# Patient Record
Sex: Female | Born: 1970 | Race: Black or African American | Hispanic: No | Marital: Married
Health system: Southern US, Community
[De-identification: ages and names within clinical notes are randomized; demographics above are authoritative.]

---

## 2002-01-22 ENCOUNTER — Encounter: Payer: Self-pay | Admitting: Emergency Medicine

## 2002-01-22 ENCOUNTER — Emergency Department (HOSPITAL_COMMUNITY): Admission: EM | Admit: 2002-01-22 | Discharge: 2002-01-22 | Payer: Self-pay | Admitting: Emergency Medicine

## 2002-02-14 ENCOUNTER — Encounter: Payer: Self-pay | Admitting: Emergency Medicine

## 2002-02-14 ENCOUNTER — Emergency Department (HOSPITAL_COMMUNITY): Admission: EM | Admit: 2002-02-14 | Discharge: 2002-02-14 | Payer: Self-pay | Admitting: Emergency Medicine

## 2002-11-10 ENCOUNTER — Observation Stay (HOSPITAL_COMMUNITY): Admission: RE | Admit: 2002-11-10 | Discharge: 2002-11-11 | Payer: Self-pay | Admitting: General Surgery

## 2002-11-10 ENCOUNTER — Encounter: Payer: Self-pay | Admitting: General Surgery

## 2002-11-10 ENCOUNTER — Encounter (INDEPENDENT_AMBULATORY_CARE_PROVIDER_SITE_OTHER): Payer: Self-pay | Admitting: Specialist

## 2004-05-31 ENCOUNTER — Ambulatory Visit (HOSPITAL_COMMUNITY): Admission: RE | Admit: 2004-05-31 | Discharge: 2004-05-31 | Payer: Self-pay | Admitting: Obstetrics

## 2004-06-14 ENCOUNTER — Encounter (INDEPENDENT_AMBULATORY_CARE_PROVIDER_SITE_OTHER): Payer: Self-pay | Admitting: *Deleted

## 2004-06-14 ENCOUNTER — Ambulatory Visit (HOSPITAL_COMMUNITY): Admission: RE | Admit: 2004-06-14 | Discharge: 2004-06-14 | Payer: Self-pay | Admitting: Obstetrics

## 2005-08-13 ENCOUNTER — Inpatient Hospital Stay (HOSPITAL_COMMUNITY): Admission: RE | Admit: 2005-08-13 | Discharge: 2005-08-20 | Payer: Self-pay | Admitting: Psychiatry

## 2005-08-14 ENCOUNTER — Ambulatory Visit: Payer: Self-pay | Admitting: Psychiatry

## 2008-08-15 ENCOUNTER — Inpatient Hospital Stay (HOSPITAL_COMMUNITY): Admission: AD | Admit: 2008-08-15 | Discharge: 2008-08-20 | Payer: Self-pay | Admitting: Psychiatry

## 2008-08-15 ENCOUNTER — Ambulatory Visit: Payer: Self-pay | Admitting: Psychiatry

## 2010-03-18 ENCOUNTER — Emergency Department (HOSPITAL_COMMUNITY): Admission: EM | Admit: 2010-03-18 | Discharge: 2010-03-18 | Payer: Self-pay | Admitting: Emergency Medicine

## 2010-10-19 LAB — DIFFERENTIAL
Basophils Absolute: 0 10*3/uL (ref 0.0–0.1)
Basophils Relative: 0 % (ref 0–1)
Eosinophils Absolute: 0.1 10*3/uL (ref 0.0–0.7)
Eosinophils Relative: 2 % (ref 0–5)
Lymphocytes Relative: 33 % (ref 12–46)
Lymphs Abs: 1.8 10*3/uL (ref 0.7–4.0)
Monocytes Absolute: 0.5 10*3/uL (ref 0.1–1.0)
Monocytes Relative: 10 % (ref 3–12)
Neutro Abs: 3 10*3/uL (ref 1.7–7.7)
Neutrophils Relative %: 56 % (ref 43–77)

## 2010-10-19 LAB — COMPREHENSIVE METABOLIC PANEL
ALT: 24 U/L (ref 0–35)
AST: 52 U/L — ABNORMAL HIGH (ref 0–37)
Albumin: 3.5 g/dL (ref 3.5–5.2)
Alkaline Phosphatase: 53 U/L (ref 39–117)
BUN: 10 mg/dL (ref 6–23)
CO2: 25 mEq/L (ref 19–32)
Calcium: 8.6 mg/dL (ref 8.4–10.5)
Chloride: 107 mEq/L (ref 96–112)
Creatinine, Ser: 0.65 mg/dL (ref 0.4–1.2)
GFR calc Af Amer: >60 mL/min (ref >60)
GFR calc non Af Amer: >60 mL/min (ref >60)
Glucose, Bld: 112 mg/dL — ABNORMAL HIGH (ref 70–99)
Potassium: 5.3 mEq/L — ABNORMAL HIGH (ref 3.5–5.1)
Sodium: 137 mEq/L (ref 135–145)
Total Bilirubin: 1.7 mg/dL — ABNORMAL HIGH (ref 0.3–1.2)
Total Protein: 6.6 g/dL (ref 6.0–8.3)

## 2010-10-19 LAB — URINALYSIS, ROUTINE W REFLEX MICROSCOPIC
Bilirubin Urine: NEGATIVE
Glucose, UA: NEGATIVE mg/dL
Hgb urine dipstick: NEGATIVE
Ketones, ur: NEGATIVE mg/dL
Nitrite: NEGATIVE
Protein, ur: NEGATIVE mg/dL
Specific Gravity, Urine: 1.025 (ref 1.005–1.030)
Urobilinogen, UA: 1 mg/dL (ref 0.0–1.0)
pH: 6 (ref 5.0–8.0)

## 2010-10-19 LAB — CBC
HCT: 38.6 % (ref 36.0–46.0)
Hemoglobin: 13.3 g/dL (ref 12.0–15.0)
MCH: 30.2 pg (ref 26.0–34.0)
MCHC: 34.5 g/dL (ref 30.0–36.0)
MCV: 87.5 fL (ref 78.0–100.0)
Platelets: 196 10*3/uL (ref 150–400)
RBC: 4.41 MIL/uL (ref 3.87–5.11)
RDW: 12.8 % (ref 11.5–15.5)
WBC: 5.4 10*3/uL (ref 4.0–10.5)

## 2010-10-19 LAB — URINE MICROSCOPIC-ADD ON

## 2010-10-19 LAB — POCT PREGNANCY, URINE: Preg Test, Ur: NEGATIVE

## 2010-10-19 LAB — LIPASE, BLOOD: Lipase: 20 U/L (ref 11–59)

## 2010-11-19 LAB — URINALYSIS, ROUTINE W REFLEX MICROSCOPIC
Bilirubin Urine: NEGATIVE
Glucose, UA: NEGATIVE mg/dL
Hgb urine dipstick: NEGATIVE
Ketones, ur: NEGATIVE mg/dL
Nitrite: NEGATIVE
Protein, ur: NEGATIVE mg/dL
Specific Gravity, Urine: 1.028 (ref 1.005–1.030)
Urobilinogen, UA: 0.2 mg/dL (ref 0.0–1.0)
pH: 6.5 (ref 5.0–8.0)

## 2010-11-19 LAB — DIFFERENTIAL
Basophils Absolute: 0 10*3/uL (ref 0.0–0.1)
Basophils Relative: 0 % (ref 0–1)
Eosinophils Absolute: 0.2 10*3/uL (ref 0.0–0.7)
Eosinophils Relative: 2 % (ref 0–5)
Lymphocytes Relative: 31 % (ref 12–46)
Lymphs Abs: 2.4 10*3/uL (ref 0.7–4.0)
Monocytes Absolute: 0.5 10*3/uL (ref 0.1–1.0)
Monocytes Relative: 7 % (ref 3–12)
Neutro Abs: 4.7 10*3/uL (ref 1.7–7.7)
Neutrophils Relative %: 60 % (ref 43–77)

## 2010-11-19 LAB — COMPREHENSIVE METABOLIC PANEL
ALT: 21 U/L (ref 0–35)
AST: 22 U/L (ref 0–37)
Albumin: 3.7 g/dL (ref 3.5–5.2)
Alkaline Phosphatase: 74 U/L (ref 39–117)
BUN: 14 mg/dL (ref 6–23)
CO2: 29 mEq/L (ref 19–32)
Calcium: 9.4 mg/dL (ref 8.4–10.5)
Chloride: 104 mEq/L (ref 96–112)
Creatinine, Ser: 0.9 mg/dL (ref 0.4–1.2)
GFR calc Af Amer: >60 mL/min (ref >60)
GFR calc non Af Amer: >60 mL/min (ref >60)
Glucose, Bld: 107 mg/dL — ABNORMAL HIGH (ref 70–99)
Potassium: 3.8 mEq/L (ref 3.5–5.1)
Sodium: 139 mEq/L (ref 135–145)
Total Bilirubin: 0.5 mg/dL (ref 0.3–1.2)
Total Protein: 6.9 g/dL (ref 6.0–8.3)

## 2010-11-19 LAB — CBC
HCT: 39.5 % (ref 36.0–46.0)
Hemoglobin: 13.6 g/dL (ref 12.0–15.0)
MCHC: 34.4 g/dL (ref 30.0–36.0)
MCV: 87.6 fL (ref 78.0–100.0)
Platelets: 207 10*3/uL (ref 150–400)
RBC: 4.5 MIL/uL (ref 3.87–5.11)
RDW: 12.9 % (ref 11.5–15.5)
WBC: 7.8 10*3/uL (ref 4.0–10.5)

## 2010-11-19 LAB — TSH: TSH: 1.411 u[IU]/mL (ref 0.350–4.500)

## 2010-11-19 LAB — CARBAMAZEPINE LEVEL, TOTAL: Carbamazepine Lvl: 8 ug/mL (ref 4.0–12.0)

## 2010-11-19 LAB — VALPROIC ACID LEVEL: Valproic Acid Lvl: <1 ug/mL — ABNORMAL LOW (ref 50.0–100.0)

## 2010-12-21 NOTE — Op Note (Signed)
NAMEMICHELLE, WNEK         ACCOUNT NO.:  [id number]   MEDICAL RECORD NO.:  [id number]          PATIENT TYPE:  AMB   LOCATION:  SDC                           FACILITY:  [facility]   PHYSICIAN:  Charles A. Clearance Coots, M.D.DATE OF BIRTH:  11-Feb-1971   DATE OF PROCEDURE:  06/14/2004  DATE OF DISCHARGE:                                 OPERATIVE REPORT   PREOPERATIVE DIAGNOSES:  Menorrhagia.   POSTOPERATIVE DIAGNOSES:  Menorrhagia.   PROCEDURE:  Hysteroscopy and D&C, bipolar endometrial ablation.   SURGEON:  Charles A. Clearance Coots, M.D.   ANESTHESIA:  MAC with paracervical block.   ESTIMATED BLOOD LOSS:  Negligible.   COMPLICATIONS:  None.   SPECIMENS:  Endometrial curettings.   DESCRIPTION OF PROCEDURE:  The patient was brought to the operating room and  after satisfactory IV sedation, legs were brought up in stirrups and the  vagina was prepped and draped in the usual sterile fashion.  The urinary  bladder was emptied of approximately 50 mL of clear urine. Bimanual  examination revealed the uterus to be mid position, normal size, shape and  contour.  A weighted speculum was placed in the vaginal vault and the cervix  was isolated.  The anterior lip of the cervix was grasped with a single  tooth tenaculum. The distance to the anatomic internal os of the cervix was  measured with a Hegar dilator and was found to be 4 cm from the external os  to the internal os.  The uterus was then sounded to 10 cm with a total  operative cavity length of 6 cm.  The hysteroscope was then introduced into  the uterine cavity revealed a lush endometrium but no evidence of  endometrial polyps.  The hysteroscope was then retired. The endometrial  surface was then curetted with a small serrated curette and the specimen was  submitted to pathology for evaluation.  The cervix was then dilated to a #21  Pratt dilator and the bipolar endometrial ablation procedure was then  performed in routine fashion at a power  of 132 watts and a time of 49  minutes.  The bipolar instrument was then retired and followup hysteroscopic  survey revealed the endometrial surface to be thoroughly ablated.  The  hysteroscope was then retired. The patient tolerated the procedure well and  was transported to the recovery room in satisfactory condition.     Char   CAH/MEDQ  D:  06/14/2004  T:  06/14/2004  Job:  [id number]
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
## 2010-12-21 NOTE — Op Note (Signed)
NAMEJULEAH, Becky Hampton                          ACCOUNT NO.:  [id number]   MEDICAL RECORD NO.:  [id number]                   PATIENT TYPE:  OBV   LOCATION:  0468                                 [facility]   PHYSICIAN:  Angelia Mould. Derrell Lolling, M.D.             DATE OF BIRTH:  [birth year]   DATE OF PROCEDURE:  11/10/2002  DATE OF DISCHARGE:                                 OPERATIVE REPORT   PREOPERATIVE DIAGNOSES:  Chronic cholecystitis with cholelithiasis.   POSTOPERATIVE DIAGNOSES:  Chronic cholecystitis with cholelithiasis.   OPERATION PERFORMED:  Laparoscopic cholecystectomy with intraoperative  cholangiogram.   SURGEON:  Angelia Mould. Derrell Lolling, M.D.   FIRST ASSISTANT:  Maisie Fus B. Samuella Cota, M.D.   INDICATIONS FOR PROCEDURE:  This is a 40 year old black female who has a one  year history of intermittent episodes of right upper quadrant pain and right  flank pain, often in the evening after supper. She has had mild intermittent  nausea and vomiting. The pain has intensified over the past three weeks. She  states that she has had some fever up to 101 at home. She was evaluated by  [facility]. On the gallbladder ultrasound at [facility] it  showed multiple gallstones but an otherwise normal imaging of the liver,  bile ducts, kidneys, spleen, pancreas and aorta. I saw her in the office on  October 28, 2002 at which time she was feeling better. Her abdomen was soft  but obese and just slightly tender in the right upper quadrant and no  palpable mass. Liver function tests are normal. She is brought to the  operating room electively.   FINDINGS:  The gallbladder was slightly thick walled, slightly discolored  and contained numerous yellow multifaceted stones. The anatomy of the cystic  duct and cystic artery and common bile duct were normal. The cholangiogram  showed small but normal intrahepatic and extrahepatic bile ducts, no filling  defects, and prompt flow of contrast into the  duodenum. The omentum was  quite extensive and obscured visualization of the small bowel and colon. The  peritoneal surfaces looked normal. There were a couple of adhesions from the  dome of the liver to the diaphragm.   TECHNIQUE:  Following the induction of general endotracheal anesthesia, the  patient's abdomen was prepped and draped in a sterile fashion. A 0.5%  Marcaine with epinephrine was used as a local infiltration anesthetic. A  vertically oriented incision was made inside the upper rim of the umbilicus.  The fascia was incised in the midline and the abdominal cavity entered under  direct vision. A 10 mm Hasson trocar was inserted and secured with a  pursestring suture of #0 Vicryl. Pneumoperitoneum was created. A video  camera was inserted with visualization and findings as described above. A 10  mm trocar was placed in the subxiphoid region and two 5 mm trocars placed in  the right upper quadrant. The gallbladder  was identified, elevated and  retracted. We dissected out the cystic duct and the cystic artery. We  isolated the cystic artery as it went onto the gallbladder wall, secured it  with metal clips and divided it. We created a large window behind the cystic  duct. We placed a metal clip on the cystic duct close to the gallbladder. A  cholangiogram was inserted into the cystic duct and a cholangiogram was  obtained using the C-arm. The cholangiogram showed normal intrahepatic and  extrahepatic bile ducts, small caliber ducts, but no filling defects and  prompt flow of contrast into the duodenum. The cholangiogram catheter was  then removed and the cystic duct was secured with multiple metal clips and  divided. The gallbladder was dissected from its bed with electrocautery and  removed through the umbilical port. The operative field was irrigated with  saline. Hemostasis was excellent. At the completion of the case, there was  no bleeding and no bile leak whatsoever. The  irrigation fluid was completely  clear. The trocars were removed under direct vision and there was no  bleeding from the trocar sites. The pneumoperitoneum was released, the  fascia at the umbilicus was closed with #0 Vicryl sutures. The skin  incisions were closed with subcuticular sutures of 4-0 Vicryl and Steri-  Strips. Clean bandages were placed and the patient taken to the recovery  room in stable condition. Estimated blood loss was about 20 mL.  Complications none. Sponge, needle and instrument counts were correct.                                                Angelia Mould. Derrell Lolling, M.D.    HMI/MEDQ  D:  11/10/2002  T:  11/11/2002  Job:  [id number]   cc:   Gabriel Earing, M.D.  [address]  [place]  [place] [address]  Fax: [phone]
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
## 2014-09-17 ENCOUNTER — Emergency Department: Payer: Self-pay | Admitting: Emergency Medicine

## 2014-09-17 DIAGNOSIS — R6884 Jaw pain: Secondary | ICD-10-CM | POA: Diagnosis not present

## 2014-09-17 DIAGNOSIS — K029 Dental caries, unspecified: Secondary | ICD-10-CM | POA: Diagnosis not present

## 2014-09-17 DIAGNOSIS — K088 Other specified disorders of teeth and supporting structures: Secondary | ICD-10-CM | POA: Diagnosis not present

## 2014-09-17 DIAGNOSIS — K0500 Acute gingivitis, plaque induced: Secondary | ICD-10-CM | POA: Diagnosis not present

## 2014-09-17 DIAGNOSIS — K0510 Chronic gingivitis, plaque induced: Secondary | ICD-10-CM | POA: Diagnosis not present

## 2014-12-31 DIAGNOSIS — N938 Other specified abnormal uterine and vaginal bleeding: Secondary | ICD-10-CM | POA: Diagnosis not present

## 2016-11-09 ENCOUNTER — Encounter: Payer: Self-pay | Admitting: Radiology

## 2016-11-09 ENCOUNTER — Emergency Department: Payer: Self-pay

## 2016-11-09 ENCOUNTER — Emergency Department
Admission: EM | Admit: 2016-11-09 | Discharge: 2016-11-10 | Disposition: A | Discharge status: Home / Self Care | Payer: Self-pay | Attending: Emergency Medicine | Admitting: Emergency Medicine

## 2016-11-09 DIAGNOSIS — N200 Calculus of kidney: Secondary | ICD-10-CM | POA: Insufficient documentation

## 2016-11-09 DIAGNOSIS — N83202 Unspecified ovarian cyst, left side: Secondary | ICD-10-CM | POA: Insufficient documentation

## 2016-11-09 DIAGNOSIS — D259 Leiomyoma of uterus, unspecified: Secondary | ICD-10-CM | POA: Insufficient documentation

## 2016-11-09 DIAGNOSIS — R102 Pelvic and perineal pain: Secondary | ICD-10-CM

## 2016-11-09 LAB — COMPREHENSIVE METABOLIC PANEL
ALBUMIN: 4.3 g/dL (ref 3.5–5.0)
ALK PHOS: 69 U/L (ref 38–126)
ALT: 22 U/L (ref 14–54)
ANION GAP: 11 (ref 5–15)
AST: 36 U/L (ref 15–41)
BILIRUBIN TOTAL: 0.9 mg/dL (ref 0.3–1.2)
BUN: 14 mg/dL (ref 6–20)
CALCIUM: 9.4 mg/dL (ref 8.9–10.3)
CO2: 26 mmol/L (ref 22–32)
Chloride: 103 mmol/L (ref 101–111)
Creatinine, Ser: 1.04 mg/dL — ABNORMAL HIGH (ref 0.44–1.00)
GLUCOSE: 130 mg/dL — AB (ref 65–99)
POTASSIUM: 2.7 mmol/L — AB (ref 3.5–5.1)
Sodium: 140 mmol/L (ref 135–145)
TOTAL PROTEIN: 8.3 g/dL — AB (ref 6.5–8.1)

## 2016-11-09 LAB — LIPASE, BLOOD: Lipase: 12 U/L (ref 11–51)

## 2016-11-09 LAB — URINALYSIS, COMPLETE (UACMP) WITH MICROSCOPIC
Bacteria, UA: NONE SEEN
Bilirubin Urine: NEGATIVE
GLUCOSE, UA: NEGATIVE mg/dL
KETONES UR: 5 mg/dL — AB
NITRITE: NEGATIVE
PH: 7 (ref 5.0–8.0)
Protein, ur: NEGATIVE mg/dL
Specific Gravity, Urine: 1.017 (ref 1.005–1.030)

## 2016-11-09 LAB — CBC
HCT: 43 % (ref 35.0–47.0)
HEMOGLOBIN: 14.7 g/dL (ref 12.0–16.0)
MCH: 29.7 pg (ref 26.0–34.0)
MCHC: 34.3 g/dL (ref 32.0–36.0)
MCV: 86.5 fL (ref 80.0–100.0)
Platelets: 215 10*3/uL (ref 150–440)
RBC: 4.97 MIL/uL (ref 3.80–5.20)
RDW: 13.1 % (ref 11.5–14.5)
WBC: 8.1 10*3/uL (ref 3.6–11.0)

## 2016-11-09 LAB — PREGNANCY, URINE: Preg Test, Ur: NEGATIVE

## 2016-11-09 MED ORDER — HYDROMORPHONE HCL 1 MG/ML IJ SOLN
INTRAMUSCULAR | Status: AC
  Filled 2016-11-09: qty 1

## 2016-11-09 MED ORDER — POTASSIUM CHLORIDE CRYS ER 20 MEQ PO TBCR
40.0000 meq | EXTENDED_RELEASE_TABLET | Freq: Once | ORAL | Status: AC
  Administered 2016-11-09: 40 meq via ORAL
  Filled 2016-11-09: qty 2

## 2016-11-09 MED ORDER — ONDANSETRON HCL 4 MG/2ML IJ SOLN
INTRAMUSCULAR | Status: AC
Start: 2016-11-09 — End: 2016-11-09
  Administered 2016-11-09: 4 mg
  Filled 2016-11-09: qty 2

## 2016-11-09 MED ORDER — SODIUM CHLORIDE 0.9 % IV SOLN
30.0000 meq | Freq: Once | INTRAVENOUS | Status: DC
  Filled 2016-11-09: qty 15

## 2016-11-09 MED ORDER — IOPAMIDOL (ISOVUE-300) INJECTION 61%
100.0000 mL | Freq: Once | INTRAVENOUS | Status: AC | PRN
  Administered 2016-11-09: 100 mL via INTRAVENOUS

## 2016-11-09 MED ORDER — IOPAMIDOL (ISOVUE-300) INJECTION 61%
30.0000 mL | Freq: Once | INTRAVENOUS | Status: AC | PRN
  Administered 2016-11-09: 30 mL via ORAL

## 2016-11-09 MED ORDER — SODIUM CHLORIDE 0.9 % IV BOLUS (SEPSIS)
1000.0000 mL | Freq: Once | INTRAVENOUS | Status: AC
  Administered 2016-11-09: 1000 mL via INTRAVENOUS

## 2016-11-09 MED ORDER — ONDANSETRON HCL 4 MG/2ML IJ SOLN
4.0000 mg | Freq: Once | INTRAMUSCULAR | Status: AC
  Administered 2016-11-09: 4 mg via INTRAVENOUS
  Filled 2016-11-09: qty 2

## 2016-11-09 MED ORDER — HYDROMORPHONE HCL 1 MG/ML IJ SOLN
1.0000 mg | Freq: Once | INTRAMUSCULAR | Status: AC
  Administered 2016-11-09: 1 mg via INTRAVENOUS

## 2016-11-09 MED ORDER — MORPHINE SULFATE (PF) 4 MG/ML IV SOLN
4.0000 mg | Freq: Once | INTRAVENOUS | Status: AC
  Administered 2016-11-09: 4 mg via INTRAVENOUS
  Filled 2016-11-09: qty 1

## 2016-11-09 NOTE — ED Notes (Signed)
Patient states 2 weeks ago she had flare up of colitis and symptoms are similar asides from the back pain is new

## 2016-11-09 NOTE — ED Notes (Signed)
IV attempt x2. Will consider [place] attempt.

## 2016-11-09 NOTE — ED Triage Notes (Signed)
Pt to ED for c/o severe lower abdominal pain that started about 1 hour ago, pt also c/o vomiting that started 1 hour ago. Pt has vomited x 2, pt actively vomiting in triage.

## 2016-11-09 NOTE — ED Provider Notes (Addendum)
[facility] Emergency Department Provider Note  ____________________________________________   First MD Initiated Contact with Patient 11/09/16 1830     (approximate)  I have reviewed the triage vital signs and the nursing notes.   HISTORY  Chief Complaint Abdominal Pain    HPI Becky Hampton is a 46 y.o. female without any chronic medical conditions was presenting to the emergency department today with sudden onset left flank pain that started several hours ago. She says that over the past several days she has had nausea vomiting and diarrhea However, she is now having a 10 out of 10 left lower quadrant and flank pain. Says that it is a stabbing type pain that has been constant. 3-4 episodes of diarrhea today. Similar number of  Vomiting episodes. Denies any blood in the vomitus or the stool. Denies any vaginal discharge. Says that she is having vaginal spotting but she is perimenopausal and this is her baseline. Says that she has a history of a cholecystectomy but still has her appendix. Denies any recent antibiotics use.   History reviewed. No pertinent past medical history.  There are no active problems to display for this patient.   No past surgical history on file.  Prior to Admission medications   Not on File    Allergies Aspirin  No family history on file.  Social History Social History  Substance Use Topics  . Smoking status: Not on file  . Smokeless tobacco: Not on file  . Alcohol use Not on file    Review of Systems Constitutional: No fever/chills Eyes: No visual changes. ENT: No sore throat. Cardiovascular: Denies chest pain. Respiratory: Denies shortness of breath. Gastrointestinal:   No constipation. Genitourinary: Negative for dysuria. Musculoskeletal: Negative for back pain. Skin: Negative for rash. Neurological: Negative for headaches, focal weakness or numbness.  10-point ROS otherwise  negative.  ____________________________________________   PHYSICAL EXAM:  VITAL SIGNS: ED Triage Vitals  Enc Vitals Group     BP 11/09/16 1826 118/64     Pulse Rate 11/09/16 1825 64     Resp 11/09/16 1825 16     Temp 11/09/16 1825 98.3 F (36.8 C)     Temp Source 11/09/16 1825 Oral     SpO2 11/09/16 1825 99 %     Weight 11/09/16 1825 208 lb (94.3 kg)     Height --      Head Circumference --      Peak Flow --      Pain Score 11/09/16 1824 10     Pain Loc --      Pain Edu? --      Excl. in [place]? --     Constitutional: Alert and oriented.  Patient has her eyes closed throughout the examination. Takes very deep breaths in between each word throughout the history. Appears uncomfortable. Eyes: Conjunctivae are normal. PERRL. EOMI. Head: Atraumatic. Nose: No congestion/rhinnorhea. Mouth/Throat: Mucous membranes are moist.   Neck: No stridor.   Cardiovascular: Normal rate, regular rhythm. Grossly normal heart sounds.   Respiratory: Normal respiratory effort.  No retractions. Lungs CTAB. Gastrointestinal: Soft with tenderness palpation to the left upper as well as left lower quadrants. No rigidity. No distention. Mild left CVA tenderness palpation. Musculoskeletal: No lower extremity tenderness nor edema.  No joint effusions. Neurologic:  Normal speech and language. No gross focal neurologic deficits are appreciated.  Skin:  Skin is warm, dry and intact. No rash noted. Psychiatric: Mood and affect are normal. Speech and behavior are  normal.  ____________________________________________   LABS (all labs ordered are listed, but only abnormal results are displayed)  Labs Reviewed  COMPREHENSIVE METABOLIC PANEL - Abnormal; Notable for the following:       Result Value   Potassium 2.7 (*)    Glucose, Bld 130 (*)    Creatinine, Ser 1.04 (*)    Total Protein 8.3 (*)    All other components within normal limits  URINALYSIS, COMPLETE (UACMP) WITH MICROSCOPIC - Abnormal; Notable for  the following:    Color, Urine YELLOW (*)    APPearance TURBID (*)    Hgb urine dipstick SMALL (*)    Ketones, ur 5 (*)    Leukocytes, UA MODERATE (*)    Squamous Epithelial / LPF 0-5 (*)    All other components within normal limits  LIPASE, BLOOD  CBC  PREGNANCY, URINE  POC URINE PREG, ED   ____________________________________________  EKG   ____________________________________________  RADIOLOGY    CT Abdomen Pelvis W Contrast (Final result)  Result time 11/09/16 22:04:35  Final result by Massie Kluver, MD (11/09/16 22:04:35)           Narrative:   CLINICAL DATA: Severe lower abdominal pain with vomiting  EXAM: CT ABDOMEN AND PELVIS WITH CONTRAST  TECHNIQUE: Multidetector CT imaging of the abdomen and pelvis was performed using the standard protocol following bolus administration of intravenous contrast.  CONTRAST: 122mL ISOVUE-300 IOPAMIDOL (ISOVUE-300) INJECTION 61%  COMPARISON: None.  FINDINGS: Lower chest: No acute abnormality.  Hepatobiliary: No focal liver abnormality or mass is seen. Status post cholecystectomy. Mild intrahepatic ductal dilatation which can be seen in setting prior cholecystectomy.  Pancreas: Unremarkable. No pancreatic ductal dilatation or surrounding inflammatory changes.  Spleen: Normal in size without focal abnormality.  Adrenals/Urinary Tract: Left-sided perinephric fat stranding with mild to moderate left-sided hydroureteronephrosis secondary to a left UVJ stone measuring 4 x 2 mm. Normal bilateral adrenal glands and right kidney.  Stomach/Bowel: Stomach is within normal limits. Appendix appears normal. No evidence of bowel wall thickening, distention, or inflammatory changes.  Vascular/Lymphatic: No significant vascular findings are present. No enlarged abdominal or pelvic lymph nodes.  Reproductive: Left ovarian simple appearing cysts measuring 4.2 x 3.3 x 3 cm. Normal right ovary. Intramural fibroid noted  within the left uterine body measuring approximately 2.5 x 1.9 x 1.5 cm.  Other: No abdominal wall hernia or abnormality. No abdominopelvic ascites. Soft tissue prominence of the umbilicus consistent with developmental protrusion of the umbilicus.  Musculoskeletal: Mild degenerate change along the dorsal spine. Small sclerotic focus in the right iliac bone consistent a bone island.  IMPRESSION: 1. Left-sided perinephric fat stranding with mild to moderate left-sided hydroureteronephrosis secondary to a left UVJ stone measuring 4 x 2 mm. 2. Simple left ovarian cyst measuring 4 x 2 x 3.3 x 3 cm. 3. Left-sided intramural uterine body fibroid measuring 2.5 x 1.9 x 1.5 cm.   Electronically Signed By: Ashley Royalty M.D. On: 11/09/2016 22:04          ____________________________________________   PROCEDURES  Procedure(s) performed:   Procedures  Critical Care performed:   ____________________________________________   INITIAL IMPRESSION / ASSESSMENT AND PLAN / ED COURSE  Pertinent labs & imaging results that were available during my care of the patient were reviewed by me and considered in my medical decision making (see chart for details).  ----------------------------------------- 12:01 AM on 11/10/2016 -----------------------------------------  Patient's pain is well-controlled after Dilaudid. Found to have a stone as well as a left-sided ovarian cyst.  Also with a small fibroid on the left as well. I suspect that the patient's pain is from a stone. However, I do want to rule out any possible ovarian torsion. She is pending ultrasound at this time. Likely outcome is for the patient be discharged home with pain control, prophylactic antibiotics for a mild UTI as well as Flomax. Patient is understanding of the diagnoses and plan follow-up as an outpatient. Signed out to Dr. Karma Greaser.  Clinical Course as of Apr 08 0001  Sat Nov 09, 2016  2337 Assuming care from Dr.  Clearnce Hasten.  In short, Becky Hampton is a 46 y.o. female with a chief complaint of left sided abdominal pain.  Refer to the original H&P for additional details.  The current plan of care is to follow up pelvic ultrasounds for further evaluation of ovarian cyst.  Patient does have a 4-mm stone as well.   [CF]    Clinical Course User Index [CF] Hinda Kehr, MD     ____________________________________________   FINAL CLINICAL IMPRESSION(S) / ED DIAGNOSES  Final diagnoses:  Pelvic pain  Pelvic pain   Ureteral stone. Ovarian cyst. Uterine fibroid.   NEW MEDICATIONS STARTED DURING THIS VISIT:  New Prescriptions   No medications on file     Note:  This document was prepared using Dragon voice recognition software and may include unintentional dictation errors.    Orbie Pyo, MD 11/10/16 0002  Discussed return precautions such as any worsening abdominal pain, nausea vomiting and fever. Patient knows to return for any worsening or concerning symptoms.    Orbie Pyo, MD 11/10/16 [phone]

## 2016-11-10 MED ORDER — OXYCODONE-ACETAMINOPHEN 5-325 MG PO TABS: 1.0000 | ORAL_TABLET | Freq: Four times a day (QID) | ORAL | 0 refills | Status: AC | PRN

## 2016-11-10 MED ORDER — TAMSULOSIN HCL 0.4 MG PO CAPS: 0.4000 mg | ORAL_CAPSULE | Freq: Every day | ORAL | 0 refills | Status: AC

## 2016-11-10 MED ORDER — CEPHALEXIN 500 MG PO CAPS: 500.0000 mg | ORAL_CAPSULE | Freq: Three times a day (TID) | ORAL | 0 refills | Status: AC

## 2016-11-10 MED ORDER — CEPHALEXIN 500 MG PO CAPS
500.0000 mg | ORAL_CAPSULE | Freq: Once | ORAL | Status: AC
  Administered 2016-11-10: 500 mg via ORAL
  Filled 2016-11-10: qty 1

## 2016-11-10 MED ORDER — POTASSIUM CHLORIDE ER 10 MEQ PO TBCR: 40.0000 meq | EXTENDED_RELEASE_TABLET | Freq: Every day | ORAL | 0 refills | Status: AC

## 2016-11-10 MED ORDER — ONDANSETRON 4 MG PO TBDP
4.0000 mg | ORAL_TABLET | Freq: Once | ORAL | Status: AC
  Administered 2016-11-10: 4 mg via ORAL
  Filled 2016-11-10: qty 1

## 2016-11-10 NOTE — ED Provider Notes (Signed)
Clinical Course as of Nov 11 231  Sat Nov 09, 2016  2337 Assuming care from Dr. Clearnce Hasten.  In short, Becky Hampton is a 46 y.o. female with a chief complaint of left sided abdominal pain.  Refer to the original H&P for additional details.  The current plan of care is to follow up pelvic ultrasounds for further evaluation of ovarian cyst.  Patient does have a 4-mm stone as well.   [CF]  Sun Nov 10, 2016  0231 Ultrasound is reassuring.  Patient is stable.  We will discharge as per the recommendations from Dr. Clearnce Hasten.  She understands and agrees with the plan as does her family.  [CF]    Clinical Course User Index [CF] Hinda Kehr, MD      Hinda Kehr, MD 11/10/16 [phone]

## 2018-11-24 IMAGING — CT CT ABD-PELV W/ CM
2 of 5 series · 16 of 46 positions shown, 18 images · IV contrast (iopamidol)
Comparison: None.

CLINICAL DATA: Severe lower abdominal pain with vomiting

EXAM:
CT ABDOMEN AND PELVIS WITH CONTRAST
TECHNIQUE: Multidetector CT imaging of the abdomen and pelvis was performed
using the standard protocol following bolus administration of
intravenous contrast.
CONTRAST:  100mL [id number] IOPAMIDOL ([id number]) INJECTION 61%

[Series 2: routine abd/pel with · axial · 0.98mm/px · z∈[+1148,+1528]mm · 13 of 88 slices shown, 15 images]
[im 6/88  soft-tissue]
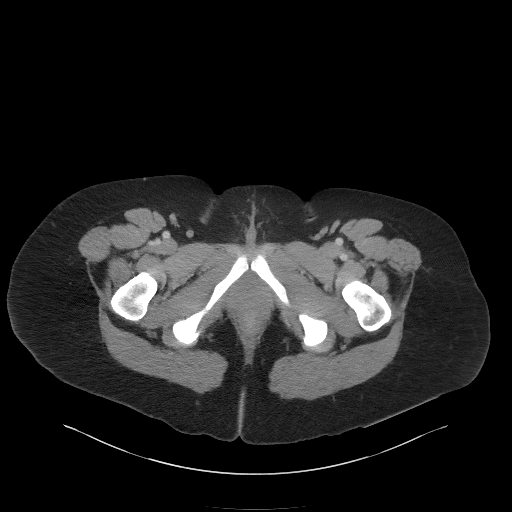
[im 6/88  bone]
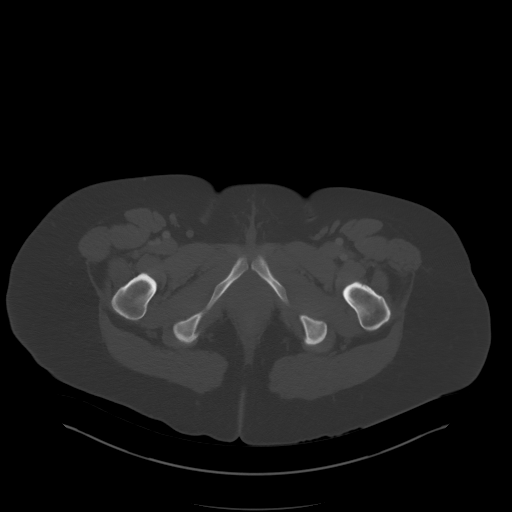
[im 11/88  soft-tissue]
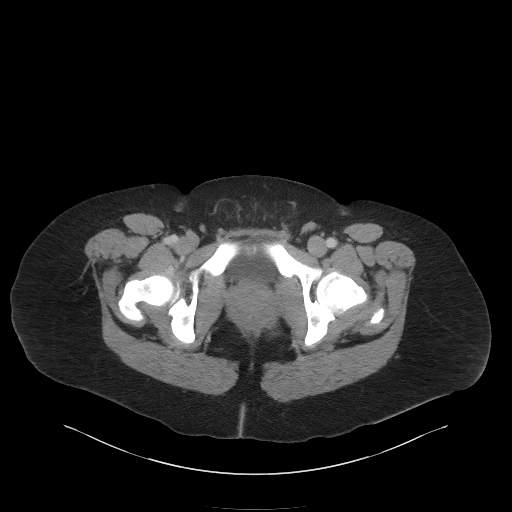
[im 17/88  soft-tissue]
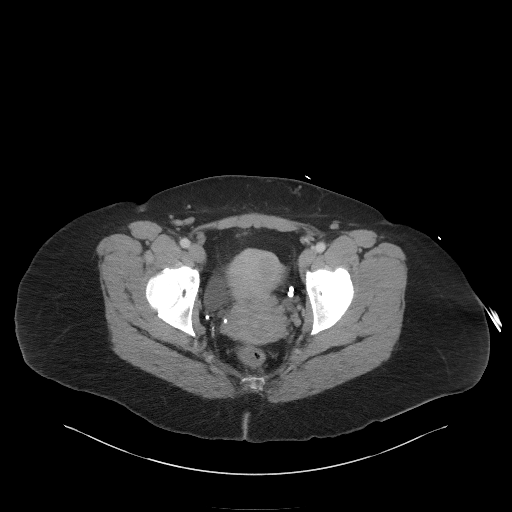
[im 28/88  soft-tissue]
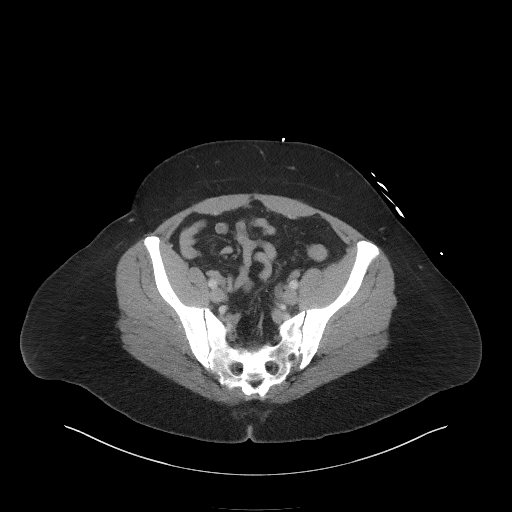
[im 33/88  soft-tissue]
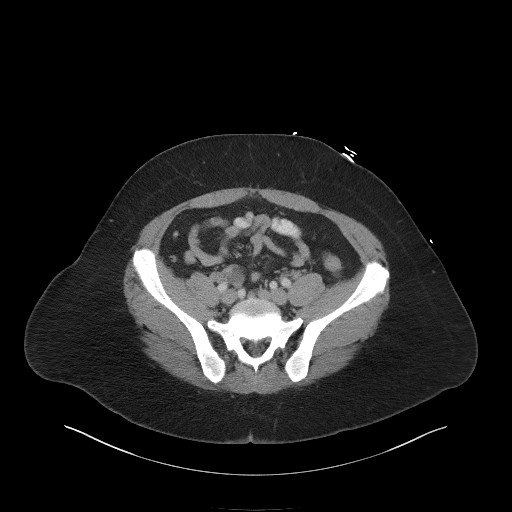
[im 39/88  soft-tissue]
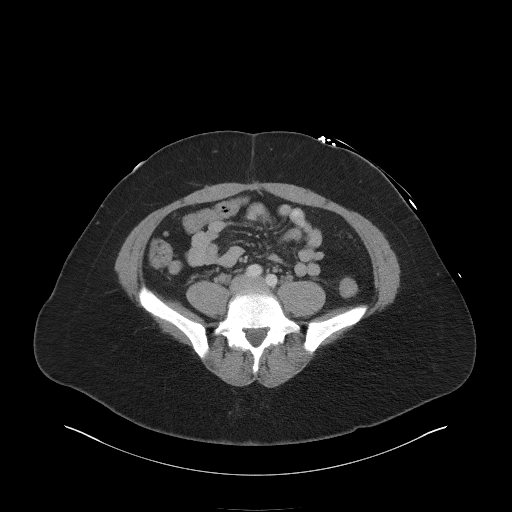
[im 44/88  soft-tissue]
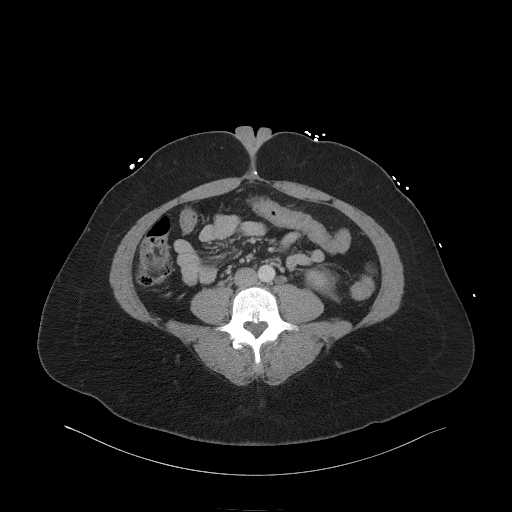
[im 49/88  soft-tissue]
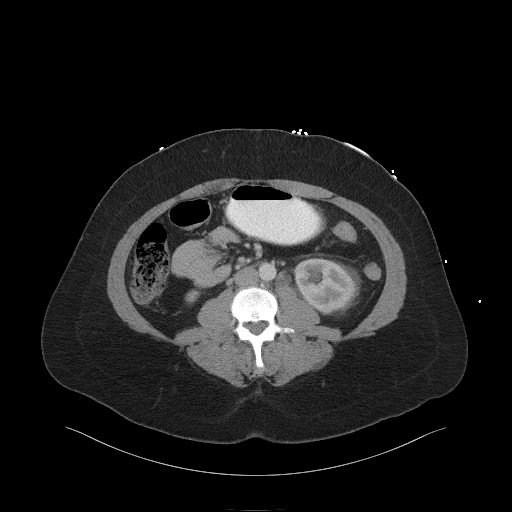
[im 55/88  soft-tissue]
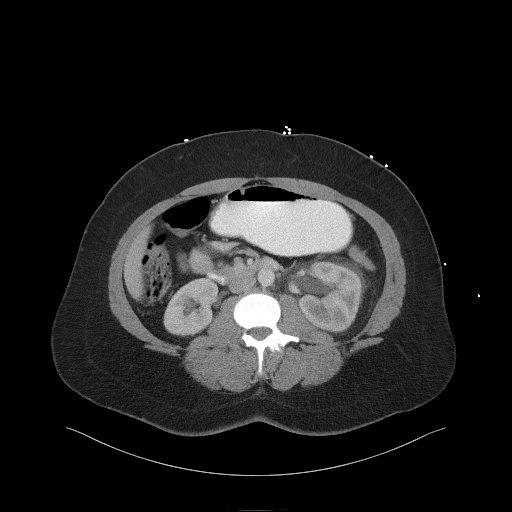
[im 55/88  bone]
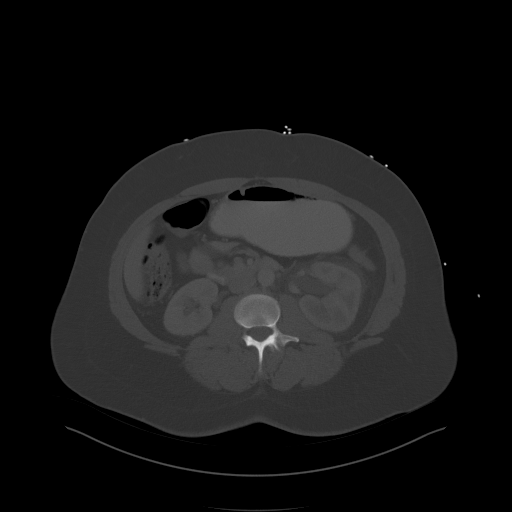
[im 60/88  soft-tissue]
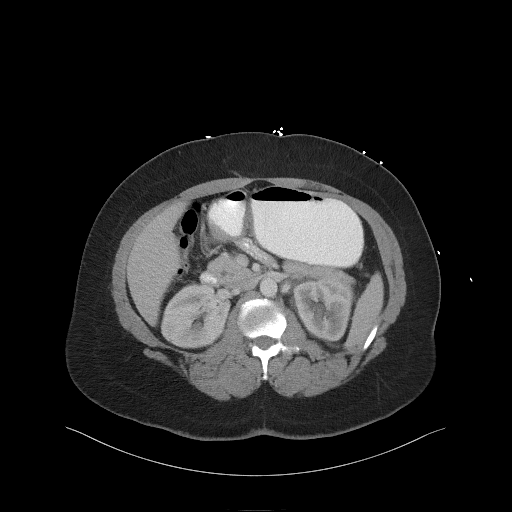
[im 71/88  soft-tissue]
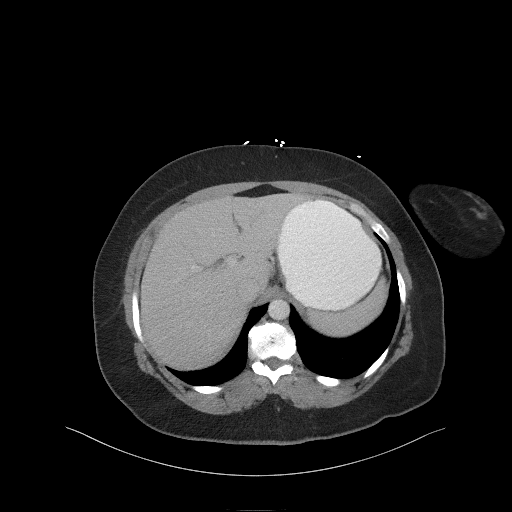
[im 77/88  soft-tissue]
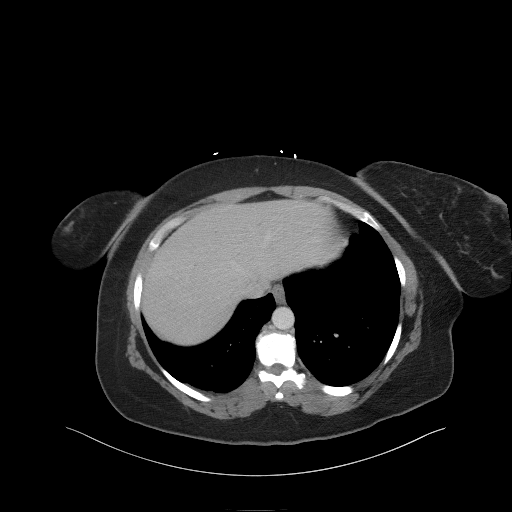
[im 82/88  soft-tissue]
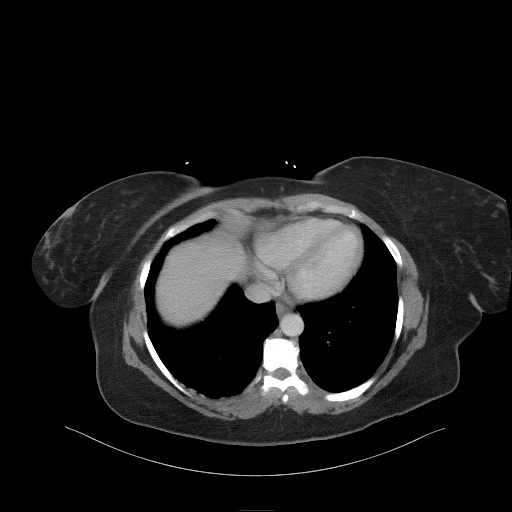

[Series 5: coronal st · coronal · 0.75mm/px · 3 of 102 slices shown]
[im 34/102  soft-tissue]
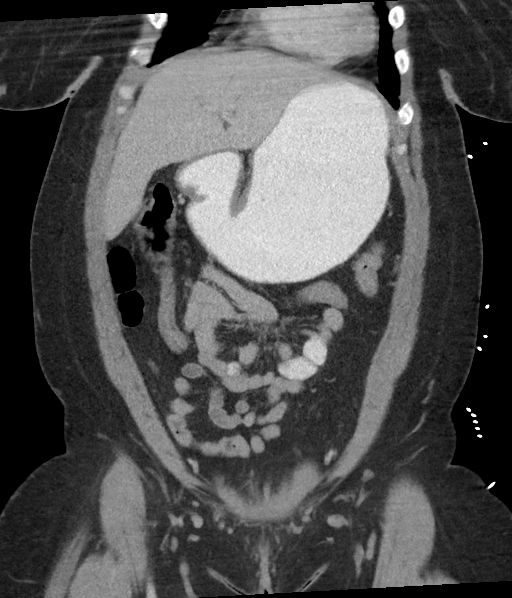
[im 45/102  soft-tissue]
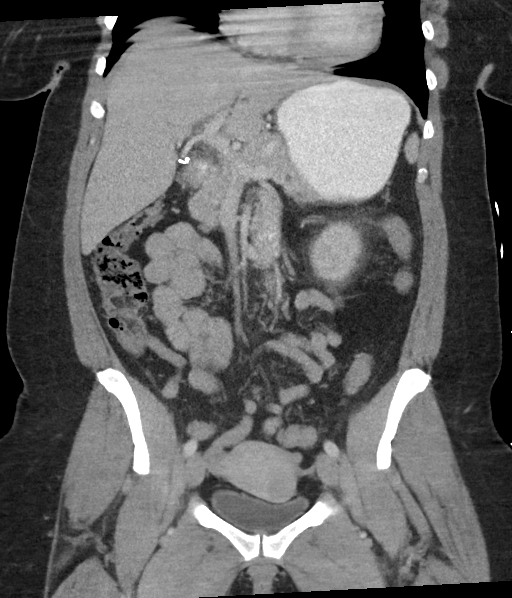
[im 57/102  soft-tissue]
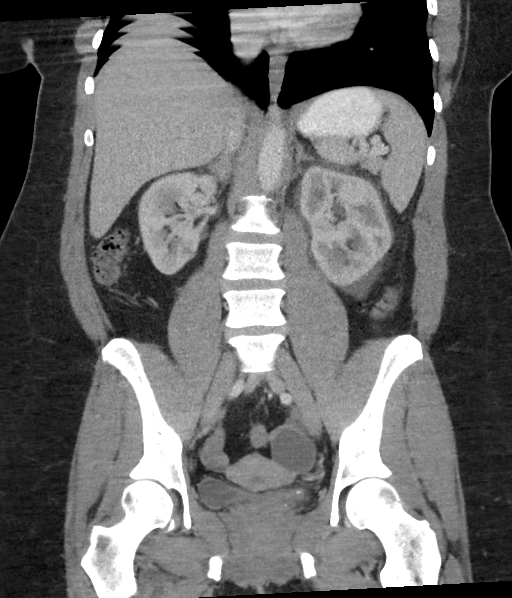

[16 of 46 positions shown; findings below may reference images not displayed]

FINDINGS: Lower chest: No acute abnormality.

Hepatobiliary: No focal liver abnormality or mass is seen. Status
post cholecystectomy. Mild intrahepatic ductal dilatation which can
be seen in setting prior cholecystectomy.

Pancreas: Unremarkable. No pancreatic ductal dilatation or
surrounding inflammatory changes.

Spleen: Normal in size without focal abnormality.

Adrenals/Urinary Tract: Left-sided perinephric fat stranding with
mild to moderate left-sided hydroureteronephrosis secondary to a
left UVJ stone measuring 4 x 2 mm. Normal bilateral adrenal glands
and right kidney.

Stomach/Bowel: Stomach is within normal limits. Appendix appears
normal. No evidence of bowel wall thickening, distention, or
inflammatory changes.

Vascular/Lymphatic: No significant vascular findings are present. No
enlarged abdominal or pelvic lymph nodes.

Reproductive: Left ovarian simple appearing cysts measuring 4.2 x
3.3 x 3 cm. Normal right ovary. Intramural fibroid noted within the
left uterine body measuring approximately 2.5 x 1.9 x 1.5 cm.

Other: No abdominal wall hernia or abnormality. No abdominopelvic
ascites. Soft tissue prominence of the umbilicus consistent with
developmental protrusion of the umbilicus.

Musculoskeletal: Mild degenerate change along the dorsal spine.
Small sclerotic focus in the right iliac bone consistent a bone
island.
IMPRESSION: 1. Left-sided perinephric fat stranding with mild to moderate
left-sided hydroureteronephrosis secondary to a left UVJ stone
measuring 4 x 2 mm.
2. Simple left ovarian cyst measuring 4 x 2 x 3.3 x 3 cm.
3. Left-sided intramural uterine body fibroid measuring 2.5 x 1.9 x
1.5 cm.

## 2019-03-10 IMAGING — US US PELVIS COMPLETE
1 series · 13 of 25 positions shown · non-contrast
Comparison: None.

CLINICAL DATA: Abdominal pain and vomiting x6 hours. Negative urine
pregnancy test.

EXAM:
TRANSABDOMINAL AND TRANSVAGINAL ULTRASOUND OF PELVIS
DOPPLER ULTRASOUND OF OVARIES
TECHNIQUE: Both transabdominal and transvaginal ultrasound examinations of the
pelvis were performed. Transabdominal technique was performed for
global imaging of the pelvis including uterus, ovaries, adnexal
regions, and pelvic cul-de-sac.
It was necessary to proceed with endovaginal exam following the
transabdominal exam to visualize the uterus and ovaries. Color and
duplex Doppler ultrasound was utilized to evaluate blood flow to the
ovaries.

[Series 1: us pelvis complete · 0.22mm/px · 13 of 125 slices shown]
[im 1/125]
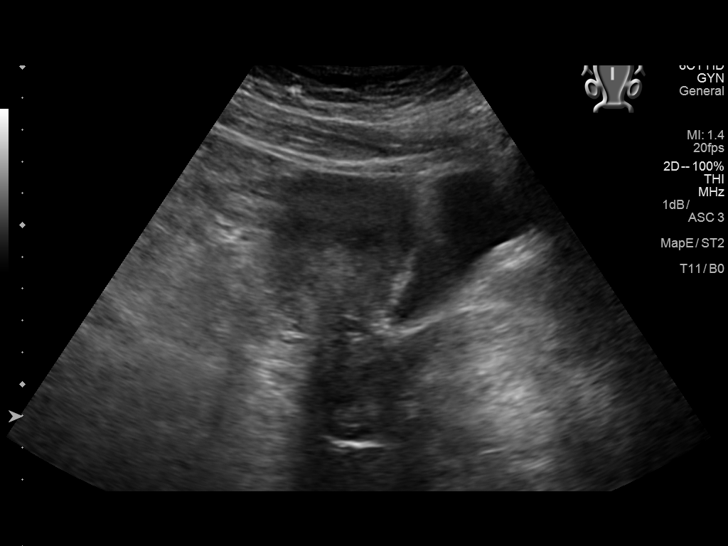
[im 11/125]
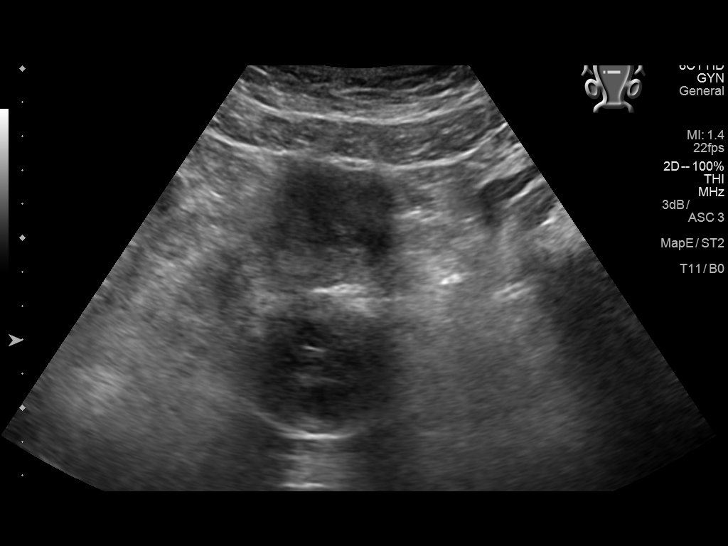
[im 21/125]
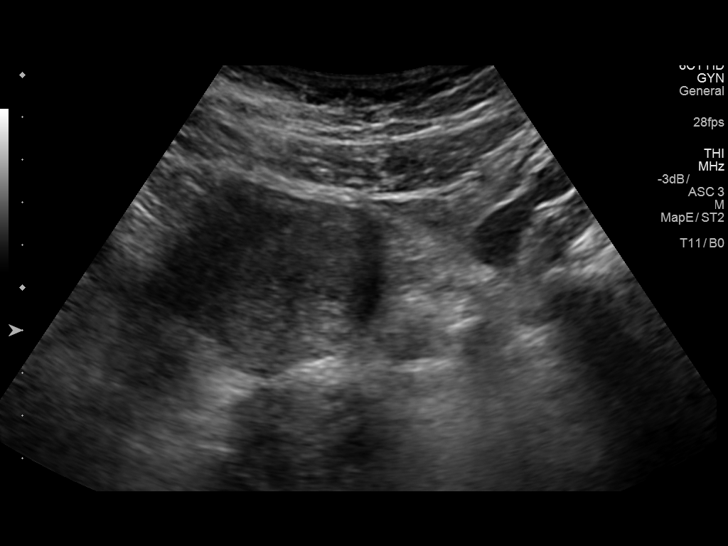
[im 32/125]
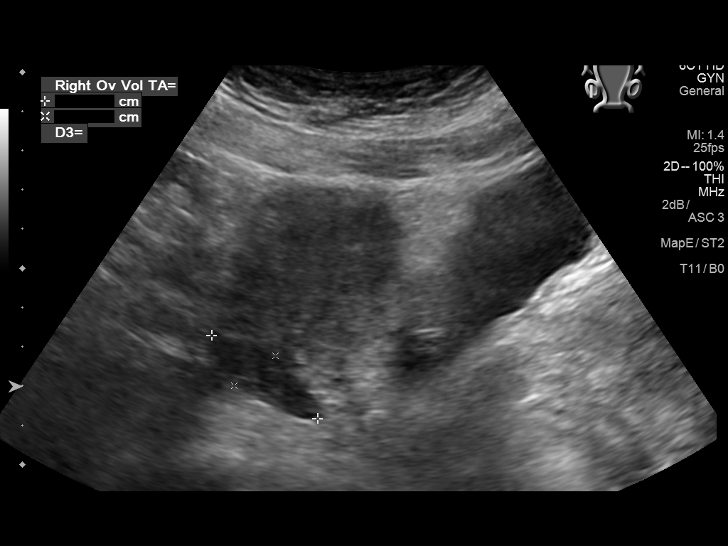
[im 42/125]
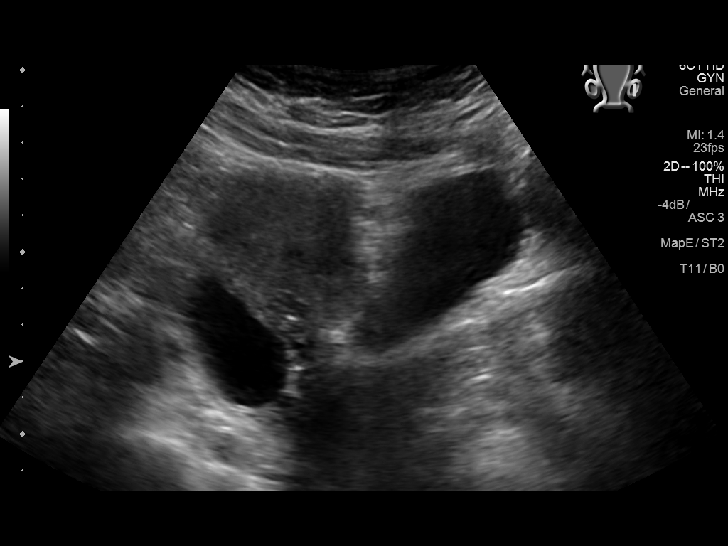
[im 52/125]
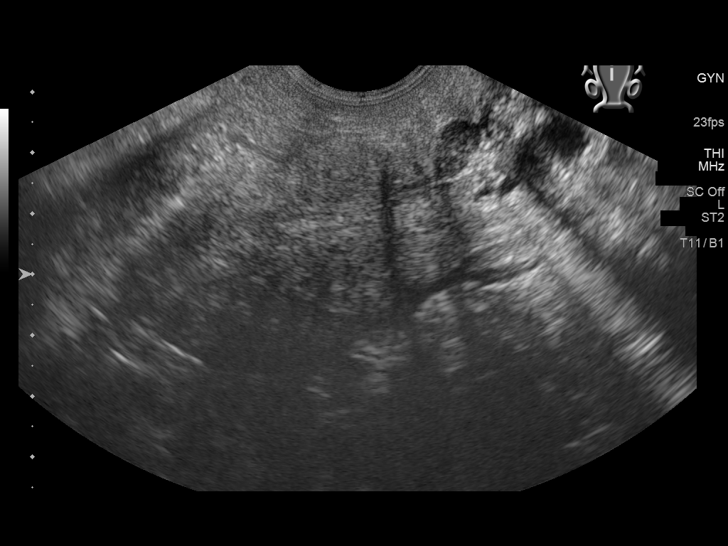
[im 63/125]
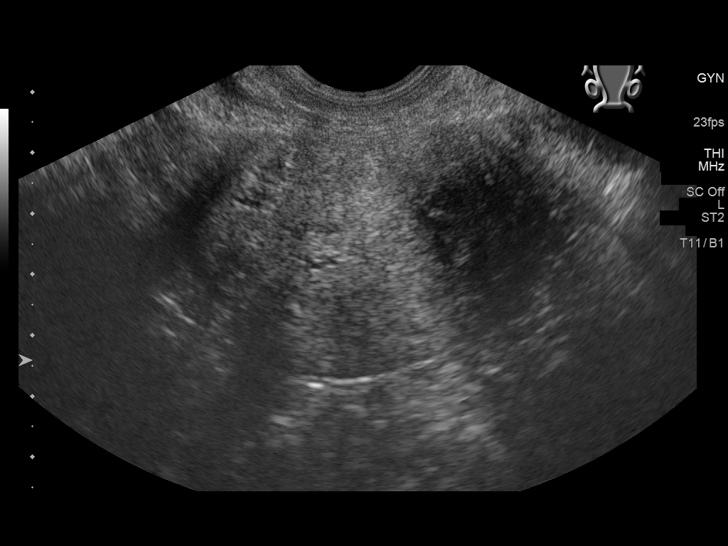
[im 73/125]
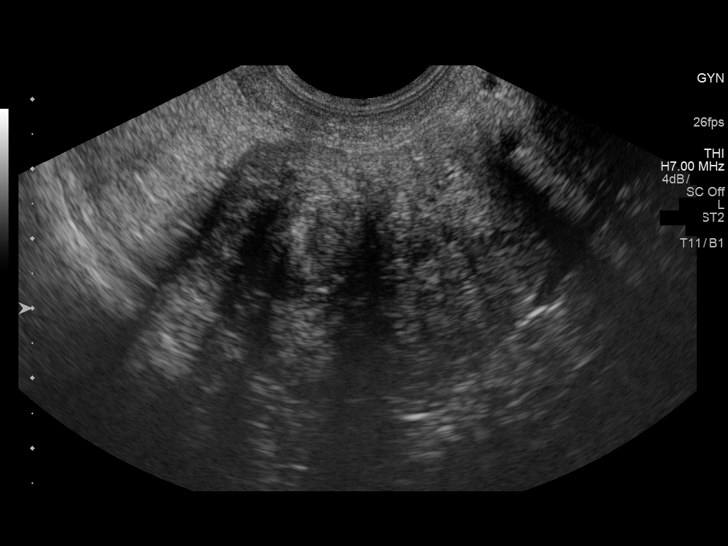
[im 83/125]
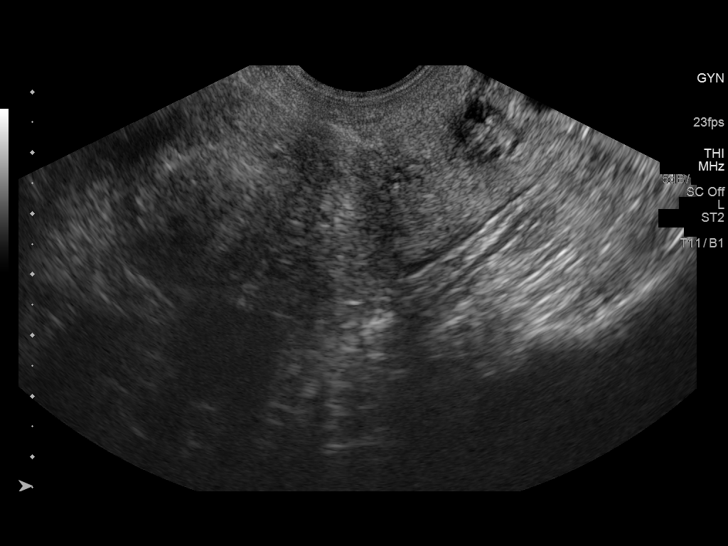
[im 94/125]
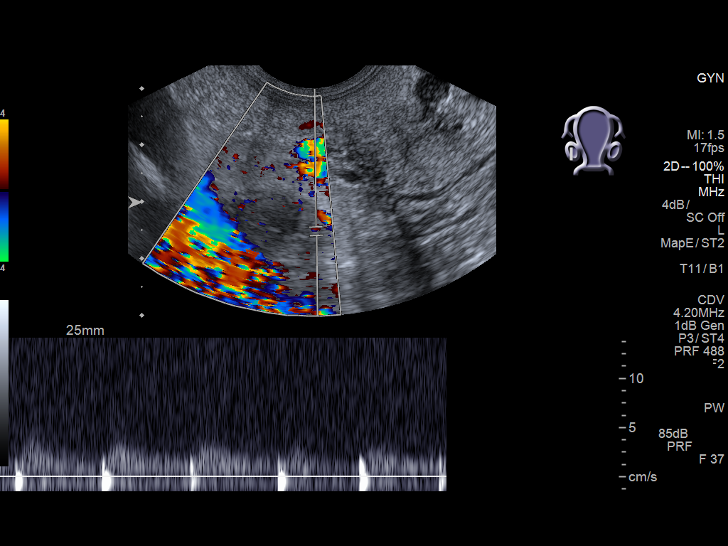
[im 104/125]
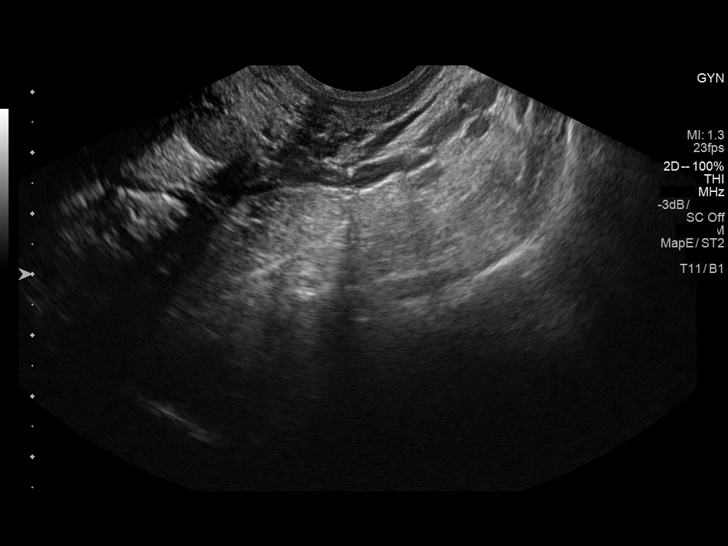
[im 114/125]
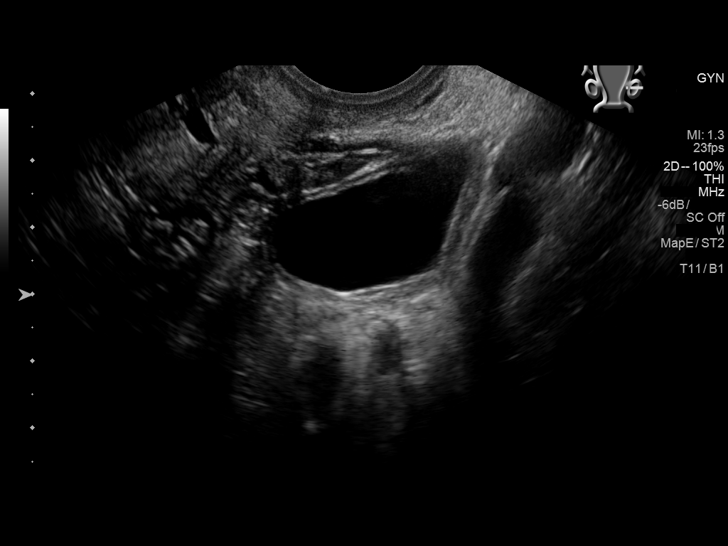
[im 125/125]
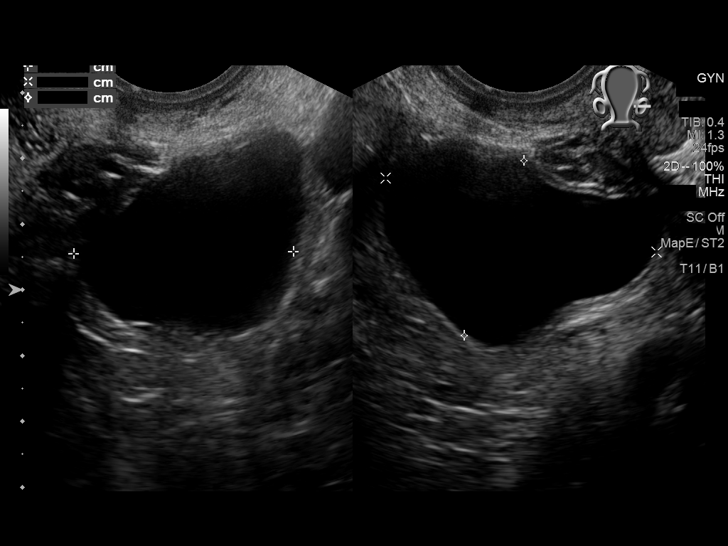

[13 of 25 positions shown; findings below may reference images not displayed]

FINDINGS: Uterus

Measurements: 8.4 x 4.5 x 5.3 cm. The uterus is heterogeneous in
appearance and contains several leiomyomas, 1 is posterior and
right-sided measuring 1.8 x 2 x 2.1 cm, a second is fundal and
intramural measuring 1 x 1.3 x 1 cm in the third is left-sided,
predominantly intramural and partially subserosal in appearance
measuring 2.3 x 2.4 x 1.9 cm.

Endometrium

Thickness: 5.7 mm.  No focal abnormality visualized.

Right ovary

Measurements: 3.4 x 1.3 x 1.7 cm. Normal appearance/no adnexal mass.

Left ovary

Measurements: 5.3 x 3.7 x 3.6 cm. There is a 4.3 x 2.8 x 3.3 cm
anechoic simple cyst associated with the left ovary.

Pulsed Doppler evaluation of both ovaries demonstrates normal
low-resistance arterial and venous waveforms.

Other findings

No abnormal free fluid.
IMPRESSION: 1. At least 3 leiomyomas are noted of the uterus as above described
the largest is approximately 2.3 x 2.4 x 1.9 cm off the uterine body
on the left.
2. No ovarian torsion. Simple left ovarian 4.3 x 2.8 x 3.3 cm cyst
is noted.
